# Patient Record
Sex: Female | Born: 1990 | Race: White | Hispanic: No | Marital: Married | Smoking: Never smoker
Health system: Southern US, Community
[De-identification: ages and names within clinical notes are randomized; demographics above are authoritative.]

## PROBLEM LIST (undated history)

## (undated) DIAGNOSIS — G43909 Migraine, unspecified, not intractable, without status migrainosus: Secondary | ICD-10-CM

---

## 2012-10-08 ENCOUNTER — Ambulatory Visit: Payer: Self-pay | Admitting: Nurse Practitioner

## 2012-10-11 ENCOUNTER — Encounter: Payer: Self-pay | Admitting: *Deleted

## 2012-10-19 ENCOUNTER — Encounter: Payer: Self-pay | Admitting: Nurse Practitioner

## 2012-10-19 ENCOUNTER — Ambulatory Visit (INDEPENDENT_AMBULATORY_CARE_PROVIDER_SITE_OTHER): Payer: BC Managed Care – PPO | Admitting: Nurse Practitioner

## 2012-10-19 VITALS — BP 138/84 | HR 70 | Wt 140.0 lb

## 2012-10-19 DIAGNOSIS — N946 Dysmenorrhea, unspecified: Secondary | ICD-10-CM

## 2012-10-19 DIAGNOSIS — G43829 Menstrual migraine, not intractable, without status migrainosus: Secondary | ICD-10-CM

## 2012-10-19 MED ORDER — NORGESTREL-ETHINYL ESTRADIOL 0.3-30 MG-MCG PO TABS: 1.0000 | ORAL_TABLET | Freq: Every day | ORAL | Status: DC

## 2012-10-21 ENCOUNTER — Encounter: Payer: Self-pay | Admitting: Nurse Practitioner

## 2012-10-21 DIAGNOSIS — N946 Dysmenorrhea, unspecified: Secondary | ICD-10-CM | POA: Insufficient documentation

## 2012-10-21 DIAGNOSIS — G43829 Menstrual migraine, not intractable, without status migrainosus: Secondary | ICD-10-CM | POA: Insufficient documentation

## 2012-10-21 NOTE — Assessment & Plan Note (Signed)
discussed options. Will switch pills to Lo Ovral. Start with next pack of pills. Patient to call back if symptoms persist, options include adding a patch at the end of her pack of pills or doing continuous pills. Use backup method first pack if needed.

## 2012-10-21 NOTE — Progress Notes (Signed)
Subjective:  Presents for recheck. Currently on Loestrin. For the past 3-4 months patient has had a dull achy headache between the eyes that began today after her last pill on week 3. Headache will last off and on for several days into her cycle. Cycle usually begins 3-4 days after her last active pill. Does not have headaches at any other time. At most 7/10 on a pain scale. Nausea but no vomiting. No photosensitivity or phonophobia. Has also begun having more cramps and nausea with her cycle, this had resolved. Currently on the third week in her pack, due for cycle next week. Denies any missed pills. No new sexual partners. No pelvic pain. No discharge.  Objective:   BP 138/84  Pulse 70  Wt 140 lb (63.504 kg)  LMP 09/26/2012 NAD. Alert, oriented. Lungs clear. Heart regular rate rhythm.  Assessment:Menstrual headache  Dysmenorrhea  Meds ordered this encounter  Medications  . DISCONTD: norethindrone-ethinyl estradiol-iron (MICROGESTIN FE,GILDESS FE,LOESTRIN FE) 1.5-30 MG-MCG tablet    Sig: Take 1 tablet by mouth daily.  . norgestrel-ethinyl estradiol (LO/OVRAL,CRYSELLE) 0.3-30 MG-MCG tablet    Sig: Take 1 tablet by mouth daily.    Dispense:  3 Package    Refill:  3    Order Specific Question:  Supervising Provider    Answer:  Merlyn Albert [[id number]]   discussed options. Will switch pills to Lo Ovral. Start with next pack of pills. Patient to call back if symptoms persist, options include adding a patch at the end of her pack of pills or doing continuous pills. Use backup method first pack if needed.

## 2012-10-25 ENCOUNTER — Telehealth: Payer: Self-pay | Admitting: Nurse Practitioner

## 2012-10-25 ENCOUNTER — Other Ambulatory Visit: Payer: Self-pay | Admitting: Nurse Practitioner

## 2012-10-25 MED ORDER — IBUPROFEN 800 MG PO TABS: 800.0000 mg | ORAL_TABLET | Freq: Three times a day (TID) | ORAL | Status: DC | PRN

## 2012-10-25 NOTE — Telephone Encounter (Signed)
Patient saw Sherie Don, FNP this past Monday, May 5, and Eber Jones told her she would give her an Rx for Prescription strength Tylenol if she decided she wanted it.  Patient would like this called into [facility] in [place].

## 2012-10-25 NOTE — Telephone Encounter (Signed)
What strength of the ibuprofren

## 2012-10-25 NOTE — Telephone Encounter (Signed)
This should be Rx strength Ibuprofen not Tylenol.  Will send in Rx

## 2012-10-25 NOTE — Telephone Encounter (Signed)
800 mg.  Please clarify message; I was going by previous call

## 2012-10-26 NOTE — Telephone Encounter (Signed)
Med was electronically sent to [facility]. Patient notified.

## 2013-05-03 ENCOUNTER — Other Ambulatory Visit: Payer: Self-pay | Admitting: *Deleted

## 2013-05-03 MED ORDER — NORGESTREL-ETHINYL ESTRADIOL 0.3-30 MG-MCG PO TABS: 1.0000 | ORAL_TABLET | Freq: Every day | ORAL | Status: DC

## 2013-10-10 ENCOUNTER — Encounter: Payer: Self-pay | Admitting: Nurse Practitioner

## 2013-10-10 ENCOUNTER — Ambulatory Visit (INDEPENDENT_AMBULATORY_CARE_PROVIDER_SITE_OTHER): Payer: BC Managed Care – PPO | Admitting: Nurse Practitioner

## 2013-10-10 VITALS — BP 112/80 | Ht 63.0 in | Wt 137.2 lb

## 2013-10-10 DIAGNOSIS — Z0189 Encounter for other specified special examinations: Secondary | ICD-10-CM

## 2013-10-10 DIAGNOSIS — Z0000 Encounter for general adult medical examination without abnormal findings: Secondary | ICD-10-CM

## 2013-10-11 LAB — VARICELLA ZOSTER ANTIBODY, IGG: Varicella IgG: 913.1 Index — ABNORMAL HIGH (ref <135.00)

## 2013-10-14 ENCOUNTER — Encounter: Payer: Self-pay | Admitting: Nurse Practitioner

## 2013-10-14 LAB — VARICELLA ZOSTER ANTIBODY, IGM: Varicella Zoster Ab IgM: 0.5 {ISR} (ref <0.91)

## 2013-10-14 NOTE — Progress Notes (Signed)
   Subjective:    Patient ID: Kara Serrano, female    DOB: 10-12-1990, 23 y.o.   MRN: [id number]  HPI Presents for her wellness physical. Reviewed guidelines for first PAP smear, patient has not had PAP before. Guidelines recommend first PAP at age 23 irregardless of sexual activity. Patient denies history of sexual activity and defers PAP/ GU exam today. Regular vision and dental exams. Regular menses, normal flow. Has form for school physical today.    Review of Systems  Constitutional: Negative for activity change, appetite change and fatigue.  HENT: Negative for dental problem, ear pain, sinus pressure and sore throat.   Respiratory: Negative for cough, chest tightness, shortness of breath and wheezing.   Cardiovascular: Negative for chest pain.  Gastrointestinal: Negative for nausea, vomiting, abdominal pain, diarrhea, constipation and abdominal distention.  Genitourinary: Negative for dysuria, urgency, frequency, vaginal discharge, difficulty urinating, genital sores, menstrual problem and pelvic pain.       Objective:   Physical Exam  Vitals reviewed. Constitutional: She is oriented to person, place, and time. She appears well-developed. No distress.  HENT:  Right Ear: External ear normal.  Left Ear: External ear normal.  Mouth/Throat: Oropharynx is clear and moist.  Neck: Normal range of motion. Neck supple. No tracheal deviation present. No thyromegaly present.  Cardiovascular: Normal rate, regular rhythm and normal heart sounds.  Exam reveals no gallop.   No murmur heard. Pulmonary/Chest: Effort normal and breath sounds normal.  Abdominal: Soft. She exhibits no distension. There is no tenderness.  Musculoskeletal: She exhibits no edema.  Lymphadenopathy:    She has no cervical adenopathy.  Neurological: She is alert and oriented to person, place, and time.  Skin: Skin is warm and dry. No rash noted.  Psychiatric: She has a normal mood and affect. Her behavior is  normal.  Breast exam: dense tissue; fine nodularity; no dominant masses; axillae no adenopathy.        Assessment & Plan:  Well woman exam (no gynecological exam)  Other specified examination - Plan: Varicella zoster antibody, IgG, Varicella zoster antibody, IgM  Recommend regular activity, healthy diet, vitamin D and calcium supplementation. Next PE in one year. Return in about 1 year (around 10/11/2014).

## 2013-10-15 ENCOUNTER — Ambulatory Visit (INDEPENDENT_AMBULATORY_CARE_PROVIDER_SITE_OTHER): Payer: BC Managed Care – PPO | Admitting: *Deleted

## 2013-10-15 DIAGNOSIS — Z111 Encounter for screening for respiratory tuberculosis: Secondary | ICD-10-CM

## 2013-10-17 ENCOUNTER — Other Ambulatory Visit: Payer: Self-pay | Admitting: *Deleted

## 2013-10-17 DIAGNOSIS — Z111 Encounter for screening for respiratory tuberculosis: Secondary | ICD-10-CM

## 2013-10-17 LAB — TB SKIN TEST
INDURATION: 0 mm
TB Skin Test: NEGATIVE

## 2014-03-23 ENCOUNTER — Other Ambulatory Visit: Payer: Self-pay | Admitting: Nurse Practitioner

## 2014-04-29 ENCOUNTER — Ambulatory Visit (INDEPENDENT_AMBULATORY_CARE_PROVIDER_SITE_OTHER): Payer: BC Managed Care – PPO | Admitting: *Deleted

## 2014-04-29 ENCOUNTER — Ambulatory Visit: Payer: BC Managed Care – PPO | Admitting: *Deleted

## 2014-04-29 DIAGNOSIS — Z23 Encounter for immunization: Secondary | ICD-10-CM

## 2014-06-04 ENCOUNTER — Encounter: Payer: Self-pay | Admitting: Family Medicine

## 2014-06-04 ENCOUNTER — Ambulatory Visit (INDEPENDENT_AMBULATORY_CARE_PROVIDER_SITE_OTHER): Payer: BC Managed Care – PPO | Admitting: Nurse Practitioner

## 2014-06-04 VITALS — BP 120/76 | Temp 99.0°F | Ht 63.0 in | Wt 149.0 lb

## 2014-06-04 DIAGNOSIS — J020 Streptococcal pharyngitis: Secondary | ICD-10-CM

## 2014-06-04 LAB — POCT RAPID STREP A (OFFICE): RAPID STREP A SCREEN: POSITIVE — AB

## 2014-06-04 MED ORDER — AZITHROMYCIN 250 MG PO TABS: ORAL_TABLET | ORAL | Status: DC

## 2014-06-07 ENCOUNTER — Encounter: Payer: Self-pay | Admitting: Nurse Practitioner

## 2014-06-07 NOTE — Progress Notes (Signed)
Subjective:  Presents complaints of sore throat and fever over the past 2-3 days. Began having a headache last night. No rash. Mild fatigue for 2-3 days. No vomiting diarrhea or abdominal pain. No cough or runny nose. Taking fluids well. Voiding normal limit.  Objective:   BP 120/76 mmHg  Temp(Src) 99 F (37.2 C) (Oral)  Ht 5\' 3"  (1.6 m)  Wt 149 lb (67.586 kg)  BMI 26.40 kg/m2 NAD. Alert, oriented. TMs mild clear effusion, no erythema. Pharynx minimally injected. Neck supple with mild anterior adenopathy. No posterior lymph nodes palpated. Lungs clear. Heart regular rate rhythm. Results for orders placed or performed in visit on 06/04/14  POCT rapid strep A  Result Value Ref Range   Rapid Strep A Screen Positive (A) Negative     Assessment: Streptococcal sore throat - Plan: POCT rapid strep A  Plan:  Meds ordered this encounter  Medications  . azithromycin (ZITHROMAX Z-PAK) 250 MG tablet    Sig: Take 2 tablets (500 mg) on  Day 1,  followed by 1 tablet (250 mg) once daily on Days 2 through 5.    Dispense:  6 each    Refill:  0    Order Specific Question:  Supervising Provider    Answer:  Merlyn AlbertLUKING, WILLIAM S [[id number]]   Call back in 48-72 hours if no improvement, sooner if worse.

## 2014-08-25 ENCOUNTER — Ambulatory Visit (INDEPENDENT_AMBULATORY_CARE_PROVIDER_SITE_OTHER): Payer: BLUE CROSS/BLUE SHIELD | Admitting: Family Medicine

## 2014-08-25 ENCOUNTER — Encounter: Payer: Self-pay | Admitting: Family Medicine

## 2014-08-25 VITALS — BP 128/74 | Temp 99.2°F | Ht 63.0 in | Wt 147.0 lb

## 2014-08-25 DIAGNOSIS — N3001 Acute cystitis with hematuria: Secondary | ICD-10-CM

## 2014-08-25 DIAGNOSIS — R350 Frequency of micturition: Secondary | ICD-10-CM | POA: Diagnosis not present

## 2014-08-25 LAB — POCT URINALYSIS DIPSTICK
PH UA: 5
Spec Grav, UA: 1.025

## 2014-08-25 MED ORDER — CIPROFLOXACIN HCL 250 MG PO TABS: 250.0000 mg | ORAL_TABLET | Freq: Two times a day (BID) | ORAL | Status: DC

## 2014-08-25 NOTE — Progress Notes (Signed)
   Subjective:    Patient ID: Kara Serrano, female    DOB: 07-07-1990, 24 y.o.   MRN: [id number]  Urinary Frequency  This is a new problem. Episode onset: 2 days ago. Maximum temperature: low grade fever. Associated symptoms include frequency and urgency. Associated symptoms comments: Low abd pressure, fullness. Treatments tried: cystex.    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       Objective:   Physical Exam  Alert no apparent distress. Lungs clear. Heartregular rate and rhythm. No CVA tenderness.  Urinalysis 2-4 white blood cells per high-power field      Assessment & Plan:  Impression urinary tract infection plan antibiotics prescribed symptom care discussed WSL

## 2014-09-14 ENCOUNTER — Encounter: Payer: Self-pay | Admitting: Family Medicine

## 2014-09-15 ENCOUNTER — Other Ambulatory Visit: Payer: Self-pay | Admitting: *Deleted

## 2014-09-15 MED ORDER — CEFPROZIL 500 MG PO TABS
500.0000 mg | ORAL_TABLET | Freq: Two times a day (BID) | ORAL | Status: DC
Start: 2014-09-15 — End: 2014-09-29

## 2014-09-29 ENCOUNTER — Encounter: Payer: Self-pay | Admitting: Nurse Practitioner

## 2014-09-29 ENCOUNTER — Ambulatory Visit (INDEPENDENT_AMBULATORY_CARE_PROVIDER_SITE_OTHER): Payer: BLUE CROSS/BLUE SHIELD | Admitting: Nurse Practitioner

## 2014-09-29 VITALS — BP 128/82 | Temp 98.7°F | Ht 63.0 in | Wt 149.0 lb

## 2014-09-29 DIAGNOSIS — B373 Candidiasis of vulva and vagina: Secondary | ICD-10-CM | POA: Diagnosis not present

## 2014-09-29 DIAGNOSIS — R350 Frequency of micturition: Secondary | ICD-10-CM

## 2014-09-29 DIAGNOSIS — B3731 Acute candidiasis of vulva and vagina: Secondary | ICD-10-CM

## 2014-09-29 LAB — POCT URINALYSIS DIPSTICK
PH UA: 5.5
Spec Grav, UA: 1.015

## 2014-09-29 MED ORDER — CEFPROZIL 500 MG PO TABS: 500.0000 mg | ORAL_TABLET | Freq: Two times a day (BID) | ORAL | Status: DC

## 2014-09-29 MED ORDER — FLUCONAZOLE 150 MG PO TABS: ORAL_TABLET | ORAL | Status: DC

## 2014-10-01 LAB — URINE CULTURE: Organism ID, Bacteria: NO GROWTH

## 2014-10-03 ENCOUNTER — Ambulatory Visit (INDEPENDENT_AMBULATORY_CARE_PROVIDER_SITE_OTHER): Payer: BLUE CROSS/BLUE SHIELD | Admitting: Nurse Practitioner

## 2014-10-03 ENCOUNTER — Encounter: Payer: Self-pay | Admitting: Nurse Practitioner

## 2014-10-03 VITALS — BP 128/82 | Temp 98.6°F | Ht 63.0 in | Wt 146.2 lb

## 2014-10-03 DIAGNOSIS — K5900 Constipation, unspecified: Secondary | ICD-10-CM

## 2014-10-03 DIAGNOSIS — M5432 Sciatica, left side: Secondary | ICD-10-CM

## 2014-10-03 DIAGNOSIS — R300 Dysuria: Secondary | ICD-10-CM | POA: Diagnosis not present

## 2014-10-03 LAB — POCT URINALYSIS DIPSTICK
Spec Grav, UA: 1.015
pH, UA: 6

## 2014-10-03 MED ORDER — METRONIDAZOLE 500 MG PO TABS: 500.0000 mg | ORAL_TABLET | Freq: Two times a day (BID) | ORAL | Status: DC

## 2014-10-03 MED ORDER — NAPROXEN 500 MG PO TABS: 500.0000 mg | ORAL_TABLET | Freq: Two times a day (BID) | ORAL | Status: DC

## 2014-10-03 NOTE — Patient Instructions (Addendum)
miralax  Fleets enema Magnesium citrate Back Exercises Back exercises help treat and prevent back injuries. The goal of back exercises is to increase the strength of your abdominal and back muscles and the flexibility of your back. These exercises should be started when you no longer have back pain. Back exercises include:  Pelvic Tilt. Lie on your back with your knees bent. Tilt your pelvis until the lower part of your back is against the floor. Hold this position 5 to 10 sec and repeat 5 to 10 times.  Knee to Chest. Pull first 1 knee up against your chest and hold for 20 to 30 seconds, repeat this with the other knee, and then both knees. This may be done with the other leg straight or bent, whichever feels better.  Sit-Ups or Curl-Ups. Bend your knees 90 degrees. Start with tilting your pelvis, and do a partial, slow sit-up, lifting your trunk only 30 to 45 degrees off the floor. Take at least 2 to 3 seconds for each sit-up. Do not do sit-ups with your knees out straight. If partial sit-ups are difficult, simply do the above but with only tightening your abdominal muscles and holding it as directed.  Hip-Lift. Lie on your back with your knees flexed 90 degrees. Push down with your feet and shoulders as you raise your hips a couple inches off the floor; hold for 10 seconds, repeat 5 to 10 times.  Back arches. Lie on your stomach, propping yourself up on bent elbows. Slowly press on your hands, causing an arch in your low back. Repeat 3 to 5 times. Any initial stiffness and discomfort should lessen with repetition over time.  Shoulder-Lifts. Lie face down with arms beside your body. Keep hips and torso pressed to floor as you slowly lift your head and shoulders off the floor. Do not overdo your exercises, especially in the beginning. Exercises may cause you some mild back discomfort which lasts for a few minutes; however, if the pain is more severe, or lasts for more than 15 minutes, do not  continue exercises until you see your caregiver. Improvement with exercise therapy for back problems is slow.  See your caregivers for assistance with developing a proper back exercise program. Document Released: 07/14/2004 Document Revised: 08/29/2011 Document Reviewed: 04/07/2011 [facility] Patient Information 2015 [place] [place]. This information is not intended to replace advice given to you by your health care provider. Make sure you discuss any questions you have with your health care provider.

## 2014-10-04 ENCOUNTER — Encounter: Payer: Self-pay | Admitting: Nurse Practitioner

## 2014-10-04 LAB — POCT UA - MICROSCOPIC ONLY
BACTERIA, U MICROSCOPIC: NEGATIVE
RBC, urine, microscopic: NEGATIVE
WBC, Ur, HPF, POC: NEGATIVE

## 2014-10-04 LAB — POCT WET PREP WITH KOH
BACTERIA WET PREP HPF POC: NEGATIVE
EPITHELIAL WET PREP PER HPF POC: POSITIVE
KOH Prep POC: NEGATIVE
RBC WET PREP PER HPF POC: NEGATIVE
TRICHOMONAS UA: NEGATIVE
WBC Wet Prep HPF POC: NEGATIVE
pH, Wet Prep: 5

## 2014-10-04 NOTE — Progress Notes (Signed)
Subjective:  Presents for c/o recheck of UTI. Completed Cipro given on 3/7. Symptoms improved until 3 days ago. No dysuria. Frequency mainly at night. Low back pain. No dribbling. Voiding regular amounts about 4 times per night. No constipation or diarrhea. White vaginal discharge with slight odor. Denies any history of sexual activity. External itching and burning. Has used Monistat x 2 d which has helped. No fever. Taking fluids well. Nausea, no vomiting.   Objective:   BP 128/82 mmHg  Temp(Src) 98.7 F (37.1 C)  Ht 5\' 3"  (1.6 m)  Wt 149 lb (67.586 kg)  BMI 26.40 kg/m2  LMP 08/29/2014 (Approximate) NAD. Alert, oriented. Lungs clear. Heart RRR. No CVA or flank tenderness. External GU: no rashes or lesions. Vagina: moderate amount thick white discharge, no odor. Cervix normal in appearance. Bimanual exam: no tenderness or masses. Results for orders placed or performed in visit on 09/29/14  Urine culture  Result Value Ref Range            POCT urinalysis dipstick  Result Value Ref Range   Color, UA     Clarity, UA     Glucose, UA     Bilirubin, UA     Ketones, UA     Spec Grav, UA 1.015    Blood, UA     pH, UA 5.5    Protein, UA     Urobilinogen, UA     Nitrite, UA     Leukocytes, UA moderate (2+)   POCT UA - Microscopic Only  Result Value Ref Range   WBC, Ur, HPF, POC neg    RBC, urine, microscopic neg    Bacteria, U Microscopic neg    Mucus, UA     Epithelial cells, urine per micros     Crystals, Ur, HPF, POC     Casts, Ur, LPF, POC     Yeast, UA    POCT Wet Prep with KOH  Result Value Ref Range   Trichomonas, UA Negative    Clue Cells Wet Prep HPF POC rare    Epithelial Wet Prep HPF POC pos    Yeast Wet Prep HPF POC rare    Bacteria Wet Prep HPF POC neg    RBC Wet Prep HPF POC neg    WBC Wet Prep HPF POC neg    KOH Prep POC Negative    pH, Wet Prep 5.0       Assessment: Urinary frequency - Plan: POCT urinalysis dipstick, POCT UA - Microscopic Only, Urine  culture  Vaginal candidiasis - Plan: Urine culture  Plan:  Meds ordered this encounter  Medications  . fluconazole (DIFLUCAN) 150 MG tablet    Sig: One po qd prn yeast infection; may repeat in 3-4 days if needed    Dispense:  2 tablet    Refill:  0    Order Specific Question:  Supervising Provider    Answer:  Merlyn AlbertLUKING, WILLIAM S [[id number]]  . cefPROZIL (CEFZIL) 500 MG tablet    Sig: Take 1 tablet (500 mg total) by mouth 2 (two) times daily.    Dispense:  14 tablet    Refill:  0    Order Specific Question:  Supervising Provider    Answer:  Merlyn AlbertLUKING, WILLIAM S [[id number]]   With recent history of UTI, started on Cefzil. Call back by end of week if no better, sooner if worse. Urine culture pending.

## 2014-10-05 ENCOUNTER — Encounter: Payer: Self-pay | Admitting: Nurse Practitioner

## 2014-10-05 NOTE — Progress Notes (Signed)
Subjective:  Presents with her mother for recheck. No missed oc's. No change in pills. No fever. Getting up at night to urinate. Up to 4 times per night. Minimal burning at the end. Unsure if she has any edema after school, clinical and work. No pelvic pain. Denies history of intercourse. 2 days ago began having tenderness directly behind belly button. Now describes as occasional ache. Mostly resolved. Back pain on left side into left buttock. Does not radiate down leg. Has had this once before. No specific history of injury. No incontinence. 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 Better. At least 2-3 days since her last BM. Yellowish vaginal discharge.   Objective:   BP 128/82 mmHg  Temp(Src) 98.6 F (37 C) (Oral)  Ht 5\' 3"  (1.6 m)  Wt 146 lb 3.2 oz (66.316 kg)  BMI 25.90 kg/m2  LMP 08/29/2014 (Approximate) NAD. Alert, oriented. Cheerful affect. Lungs clear. No CVA or flank tenderness. Tenderness left lumbar area into mid left buttock. SLR slightly positive on the left; negative on right. Reflexes nl lower extremities. Abdomen soft, non distended with mild tenderness with palpation of umbilical area. No masses or hernia noted with valsalva. Urine HCG neg. Normal bowel sounds.  Results for orders placed or performed in visit on 10/03/14  POCT urinalysis dipstick  Result Value Ref Range   Color, UA     Clarity, UA     Glucose, UA     Bilirubin, UA     Ketones, UA     Spec Grav, UA 1.015    Blood, UA     pH, UA 6.0    Protein, UA     Urobilinogen, UA     Nitrite, UA     Leukocytes, UA       Assessment: Dysuria - Plan: POCT urinalysis dipstick  Sciatica, left  Constipation, unspecified constipation type  Plan:  Meds ordered this encounter  Medications  . metroNIDAZOLE (FLAGYL) 500 MG tablet    Sig: Take 1 tablet (500 mg total) by mouth 2 (two) times daily with a meal.    Dispense:  14 tablet    Refill:  0    Order Specific Question:  Supervising Provider   Answer:  Merlyn AlbertLUKING, WILLIAM S [[id number]]  . naproxen (NAPROSYN) 500 MG tablet    Sig: Take 1 tablet (500 mg total) by mouth 2 (two) times daily with a meal.    Dispense:  30 tablet    Refill:  0    Order Specific Question:  Supervising Provider    Answer:  Merlyn AlbertLUKING, WILLIAM S [[id number]]   miralax as directed for constipation. Ice/heat to back. Given info on back exercises. Call back next week if no improvement, sooner if worse.

## 2014-11-28 ENCOUNTER — Other Ambulatory Visit: Payer: Self-pay | Admitting: Nurse Practitioner

## 2015-01-09 ENCOUNTER — Other Ambulatory Visit: Payer: Self-pay | Admitting: Nurse Practitioner

## 2015-02-09 ENCOUNTER — Encounter: Payer: Self-pay | Admitting: Nurse Practitioner

## 2015-02-09 ENCOUNTER — Ambulatory Visit (INDEPENDENT_AMBULATORY_CARE_PROVIDER_SITE_OTHER): Payer: BLUE CROSS/BLUE SHIELD | Admitting: Nurse Practitioner

## 2015-02-09 VITALS — BP 118/80 | Temp 98.5°F | Ht 63.0 in | Wt 148.0 lb

## 2015-02-09 DIAGNOSIS — G4440 Drug-induced headache, not elsewhere classified, not intractable: Secondary | ICD-10-CM | POA: Diagnosis not present

## 2015-02-09 DIAGNOSIS — G43829 Menstrual migraine, not intractable, without status migrainosus: Secondary | ICD-10-CM | POA: Diagnosis not present

## 2015-02-09 DIAGNOSIS — N946 Dysmenorrhea, unspecified: Secondary | ICD-10-CM

## 2015-02-09 DIAGNOSIS — N943 Premenstrual tension syndrome: Secondary | ICD-10-CM | POA: Diagnosis not present

## 2015-02-09 DIAGNOSIS — T3995XA Adverse effect of unspecified nonopioid analgesic, antipyretic and antirheumatic, initial encounter: Secondary | ICD-10-CM

## 2015-02-09 MED ORDER — NORGESTREL-ETHINYL ESTRADIOL 0.3-30 MG-MCG PO TABS: 1.0000 | ORAL_TABLET | Freq: Every day | ORAL | Status: DC

## 2015-02-09 MED ORDER — TOPIRAMATE 50 MG PO TABS: ORAL_TABLET | ORAL | Status: DC

## 2015-02-11 ENCOUNTER — Encounter: Payer: Self-pay | Admitting: Nurse Practitioner

## 2015-02-11 NOTE — Progress Notes (Signed)
Subjective:  Presents for recheck on her migraines. Has been having headaches around the time of her cycle. Last month, headache lasted during the entire menses. Also having migraines at other times especially around 3-4 pm. Throbbing pounding pain in the frontal area. Takes 2 Ibuprofen and 2 tylenol with slight relief so she can work. 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 Taking daily analgesics including tylenol PM every night and averaging Aleve at least 3-4 days per week. No change in migraine symptomatology. No numbness or weakness of the face, arms or legs. No difficulty speaking or swallowing. Nausea, no vomiting. Photosensitivity and phonophobia. No visual changes. Increased allergy symptoms including sneezing and mild head congestion. No fever, sore throat, ear pain or cough. Dysmenorrhea much improved on oc. No heavy menses. No BTB.  Objective:   BP 118/80 mmHg  Temp(Src) 98.5 F (36.9 C) (Oral)  Ht  (1.6 m)  Wt 148 lb (67.132 kg)  BMI 26.22 kg/m2  LMP 01/19/2015 NAD. Alert, oriented. TMs minimal clear effusion. Pharynx clear. Neck supple with mild anterior adenopathy. Lungs clear. Heart RRR.   Assessment:  Problem List Items Addressed This Visit      Cardiovascular and Mediastinum   Menstrual headache - Primary   Relevant Medications   topiramate (TOPAMAX) 50 MG tablet     Genitourinary   Dysmenorrhea    Other Visit Diagnoses    Analgesic rebound headache        Relevant Medications    topiramate (TOPAMAX) 50 MG tablet      Plan:  Meds ordered this encounter  Medications  . clobetasol ointment (TEMOVATE) 0.05 %    Sig:   . topiramate (TOPAMAX) 50 MG tablet    Sig: One tab po qhs x 7 d then 1 1/2 tab po qhs x 7d then 2 po qhs    Dispense:  46 tablet    Refill:  0    Order Specific Question:  Supervising Provider    Answer:  Merlyn Albert [[id number]]  . norgestrel-ethinyl estradiol (LOW-OGESTREL) 0.3-30 MG-MCG tablet    Sig: Take 1  tablet by mouth daily.    Dispense:  84 tablet    Refill:  3    Needs office visit    Order Specific Question:  Supervising Provider    Answer:  Merlyn Albert [[id number]]   Discussed options. Increase evening dose of Topamax. Wishes to continue current pill. Discussed overuse of analgesics. Try to wean off over next 2 weeks. Warning signs reviewed. Call back in 2 weeks if no improvement.

## 2015-02-18 ENCOUNTER — Encounter: Payer: Self-pay | Admitting: Nurse Practitioner

## 2015-02-25 ENCOUNTER — Encounter: Payer: Self-pay | Admitting: Nurse Practitioner

## 2015-02-25 ENCOUNTER — Other Ambulatory Visit: Payer: Self-pay | Admitting: Family Medicine

## 2015-03-03 ENCOUNTER — Encounter: Payer: Self-pay | Admitting: Nurse Practitioner

## 2015-03-10 ENCOUNTER — Telehealth: Payer: Self-pay | Admitting: Nurse Practitioner

## 2015-03-10 NOTE — Telephone Encounter (Signed)
Pt states she has been communicating with you through mychart. She stopped topamax because of the way it made her feel. She states you recommended a bp med. She wants to go ahead and start this med. Pt aware you are out of office today and will return tomorrow. [facility] [place].

## 2015-03-10 NOTE — Telephone Encounter (Signed)
Pt is wanting to get started on taking the bp meds that carolyn suggested for her headaches. Pt would like for the nurse to call her back regarding this.

## 2015-03-11 ENCOUNTER — Other Ambulatory Visit: Payer: Self-pay | Admitting: Nurse Practitioner

## 2015-03-11 MED ORDER — PROPRANOLOL HCL ER 80 MG PO CP24: 80.0000 mg | ORAL_CAPSULE | Freq: Every day | ORAL | Status: DC

## 2015-03-11 NOTE — Telephone Encounter (Signed)
Left message on voicemail notifying patient med was sent to pharmacy and to call back if no improvement in 4-6 weeks.

## 2015-03-11 NOTE — Telephone Encounter (Signed)
Med sent in to [facility]. Call back if any problems or if no improvement within 4-6 weeks.

## 2015-03-26 ENCOUNTER — Other Ambulatory Visit: Payer: Self-pay | Admitting: Nurse Practitioner

## 2015-03-26 ENCOUNTER — Telehealth: Payer: Self-pay | Admitting: Nurse Practitioner

## 2015-03-26 DIAGNOSIS — G43829 Menstrual migraine, not intractable, without status migrainosus: Secondary | ICD-10-CM

## 2015-03-26 NOTE — Telephone Encounter (Signed)
NTC: first I recommend going up one dose on current daily med; if no improvement, we will go ahead and switch. I will send in referral to specialist for headaches.

## 2015-03-26 NOTE — Telephone Encounter (Signed)
Pt calling about her migraines, the meds you issued are not working She would like to go ahead with a referral to the specialist. Is there Another med she can use? No side effects, just not working.   [facility]

## 2015-03-26 NOTE — Telephone Encounter (Signed)
Discussed with pt. Pt does want referral and to go up on dose. Pt notified that med will be sent in later today.

## 2015-03-30 ENCOUNTER — Other Ambulatory Visit: Payer: Self-pay | Admitting: Nurse Practitioner

## 2015-03-30 MED ORDER — PROPRANOLOL HCL ER 120 MG PO CP24: 120.0000 mg | ORAL_CAPSULE | Freq: Every day | ORAL | Status: AC

## 2015-04-07 ENCOUNTER — Encounter: Payer: Self-pay | Admitting: Family Medicine

## 2015-05-12 ENCOUNTER — Ambulatory Visit: Payer: BLUE CROSS/BLUE SHIELD | Admitting: Nurse Practitioner

## 2015-09-02 ENCOUNTER — Encounter: Payer: Self-pay | Admitting: Nurse Practitioner

## 2015-09-02 ENCOUNTER — Other Ambulatory Visit: Payer: Self-pay | Admitting: Nurse Practitioner

## 2015-09-02 MED ORDER — NORGESTREL-ETHINYL ESTRADIOL 0.3-30 MG-MCG PO TABS: 1.0000 | ORAL_TABLET | Freq: Every day | ORAL | Status: AC

## 2019-07-22 ENCOUNTER — Encounter: Payer: Self-pay | Admitting: Family Medicine

## 2019-12-02 ENCOUNTER — Emergency Department (HOSPITAL_COMMUNITY)
Admission: EM | Admit: 2019-12-02 | Discharge: 2019-12-03 | Disposition: A | Discharge status: Home / Self Care | Payer: Managed Care, Other (non HMO) | Attending: Emergency Medicine | Admitting: Emergency Medicine

## 2019-12-02 ENCOUNTER — Emergency Department (HOSPITAL_COMMUNITY): Payer: Managed Care, Other (non HMO)

## 2019-12-02 ENCOUNTER — Other Ambulatory Visit: Payer: Self-pay

## 2019-12-02 ENCOUNTER — Encounter (HOSPITAL_COMMUNITY): Payer: Self-pay

## 2019-12-02 DIAGNOSIS — R109 Unspecified abdominal pain: Secondary | ICD-10-CM | POA: Diagnosis not present

## 2019-12-02 DIAGNOSIS — Y92410 Unspecified street and highway as the place of occurrence of the external cause: Secondary | ICD-10-CM | POA: Diagnosis not present

## 2019-12-02 DIAGNOSIS — Y93I9 Activity, other involving external motion: Secondary | ICD-10-CM | POA: Insufficient documentation

## 2019-12-02 DIAGNOSIS — Z79899 Other long term (current) drug therapy: Secondary | ICD-10-CM | POA: Diagnosis not present

## 2019-12-02 DIAGNOSIS — S92001A Unspecified fracture of right calcaneus, initial encounter for closed fracture: Secondary | ICD-10-CM

## 2019-12-02 DIAGNOSIS — S99921A Unspecified injury of right foot, initial encounter: Secondary | ICD-10-CM | POA: Diagnosis present

## 2019-12-02 DIAGNOSIS — S2231XA Fracture of one rib, right side, initial encounter for closed fracture: Secondary | ICD-10-CM

## 2019-12-02 DIAGNOSIS — R0789 Other chest pain: Secondary | ICD-10-CM | POA: Diagnosis not present

## 2019-12-02 DIAGNOSIS — Y999 Unspecified external cause status: Secondary | ICD-10-CM | POA: Insufficient documentation

## 2019-12-02 DIAGNOSIS — R519 Headache, unspecified: Secondary | ICD-10-CM | POA: Insufficient documentation

## 2019-12-02 HISTORY — DX: Migraine, unspecified, not intractable, without status migrainosus: G43.909

## 2019-12-02 LAB — CBC
HCT: 43.8 % (ref 36.0–46.0)
Hemoglobin: 14.1 g/dL (ref 12.0–15.0)
MCH: 30.9 pg (ref 26.0–34.0)
MCHC: 32.2 g/dL (ref 30.0–36.0)
MCV: 95.8 fL (ref 80.0–100.0)
Platelets: 343 10*3/uL (ref 150–400)
RBC: 4.57 MIL/uL (ref 3.87–5.11)
RDW: 12.9 % (ref 11.5–15.5)
WBC: 20.1 10*3/uL — ABNORMAL HIGH (ref 4.0–10.5)
nRBC: 0 % (ref 0.0–0.2)

## 2019-12-02 LAB — SAMPLE TO BLOOD BANK

## 2019-12-02 LAB — BASIC METABOLIC PANEL
Anion gap: 12 (ref 5–15)
BUN: 20 mg/dL (ref 6–20)
CO2: 18 mmol/L — ABNORMAL LOW (ref 22–32)
Calcium: 8.6 mg/dL — ABNORMAL LOW (ref 8.9–10.3)
Chloride: 108 mmol/L (ref 98–111)
Creatinine, Ser: 0.76 mg/dL (ref 0.44–1.00)
GFR calc Af Amer: >60 mL/min (ref >60)
GFR calc non Af Amer: >60 mL/min (ref >60)
Glucose, Bld: 95 mg/dL (ref 70–99)
Potassium: 4 mmol/L (ref 3.5–5.1)
Sodium: 138 mmol/L (ref 135–145)

## 2019-12-02 LAB — HEPATIC FUNCTION PANEL
ALT: 24 U/L (ref 0–44)
AST: 34 U/L (ref 15–41)
Albumin: 3.9 g/dL (ref 3.5–5.0)
Alkaline Phosphatase: 69 U/L (ref 38–126)
Bilirubin, Direct: 0.3 mg/dL — ABNORMAL HIGH (ref 0.0–0.2)
Indirect Bilirubin: 0.5 mg/dL (ref 0.3–0.9)
Total Bilirubin: 0.8 mg/dL (ref 0.3–1.2)
Total Protein: 6.6 g/dL (ref 6.5–8.1)

## 2019-12-02 LAB — I-STAT BETA HCG BLOOD, ED (MC, WL, AP ONLY): I-stat hCG, quantitative: <5 m[IU]/mL (ref <5)

## 2019-12-02 MED ORDER — FENTANYL CITRATE (PF) 100 MCG/2ML IJ SOLN
50.0000 ug | Freq: Once | INTRAMUSCULAR | Status: AC
  Administered 2019-12-02: 50 ug via INTRAVENOUS
  Filled 2019-12-02: qty 2

## 2019-12-02 MED ORDER — SODIUM CHLORIDE 0.9 % IV BOLUS
500.0000 mL | Freq: Once | INTRAVENOUS | Status: AC
  Administered 2019-12-02: 500 mL via INTRAVENOUS

## 2019-12-02 MED ORDER — IOHEXOL 300 MG/ML  SOLN
100.0000 mL | Freq: Once | INTRAMUSCULAR | Status: AC | PRN
  Administered 2019-12-02: 100 mL via INTRAVENOUS

## 2019-12-02 MED ORDER — SODIUM CHLORIDE 0.9 % IV BOLUS
500.0000 mL | Freq: Once | INTRAVENOUS | Status: AC
  Administered 2019-12-03: 500 mL via INTRAVENOUS
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
## 2019-12-03 ENCOUNTER — Encounter: Payer: Self-pay | Admitting: Nurse Practitioner

## 2019-12-03 MED ORDER — OXYCODONE HCL 5 MG PO TABS: 5.0000 mg | ORAL_TABLET | ORAL | 0 refills | Status: AC | PRN

## 2019-12-03 NOTE — Progress Notes (Signed)
Orthopedic Tech Progress Note Patient Details:  Stephani Janak 1990-08-24 [id number]  Ortho Devices Type of Ortho Device: Crutches, Lenora Boys splint Ortho Device/Splint Location: rle Ortho Device/Splint Interventions: Ordered, Application, Adjustment   Post Interventions Patient Tolerated: Well Instructions Provided: Care of device, Adjustment of device   Trinna Post 12/03/2019, 1:33 AM

## 2019-12-03 NOTE — Discharge Instructions (Signed)
You have a fracture of your calcaneus bone.  Do not bear any weight your right lower extremity.  Follow-up with orthopedics on Wednesday morning at 8:30 AM.  Please take Tylenol, Motrin,  and Roxicodone as prescribed for pain.  Please keep your leg elevated above the level of your heart as much as possible.

## 2021-04-06 ENCOUNTER — Other Ambulatory Visit: Payer: Self-pay | Admitting: Orthopaedic Surgery

## 2021-04-06 DIAGNOSIS — M25571 Pain in right ankle and joints of right foot: Secondary | ICD-10-CM

## 2021-04-28 ENCOUNTER — Ambulatory Visit
Admission: RE | Admit: 2021-04-28 | Discharge: 2021-04-28 | Disposition: A | Discharge status: Home / Self Care | Payer: Managed Care, Other (non HMO) | Source: Ambulatory Visit | Attending: Orthopaedic Surgery | Admitting: Orthopaedic Surgery

## 2021-04-28 ENCOUNTER — Other Ambulatory Visit: Payer: Self-pay

## 2021-04-28 DIAGNOSIS — M25571 Pain in right ankle and joints of right foot: Secondary | ICD-10-CM

## 2022-07-26 IMAGING — MR MR ANKLE*R* W/O CM
4 of 7 series · 13 of 40 positions shown · non-contrast
Comparison: CT right foot dated December 02, 2019.

CLINICAL DATA: Chronic medial ankle pain. History of prior surgery
in 3131.

EXAM:
MRI OF THE RIGHT ANKLE WITHOUT CONTRAST
TECHNIQUE: Multiplanar, multisequence MR imaging of the ankle was performed. No
intravenous contrast was administered.

[Series 4: PD fat-sat · axial · right · 3.0mm · 0.25mm/px · z∈[-77,+47]mm · 4 of 32 slices shown]
[im 1/32]
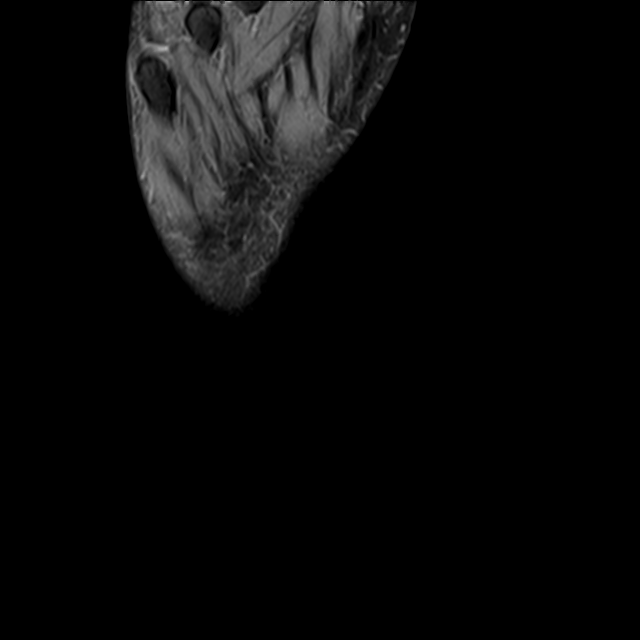
[im 7/32]
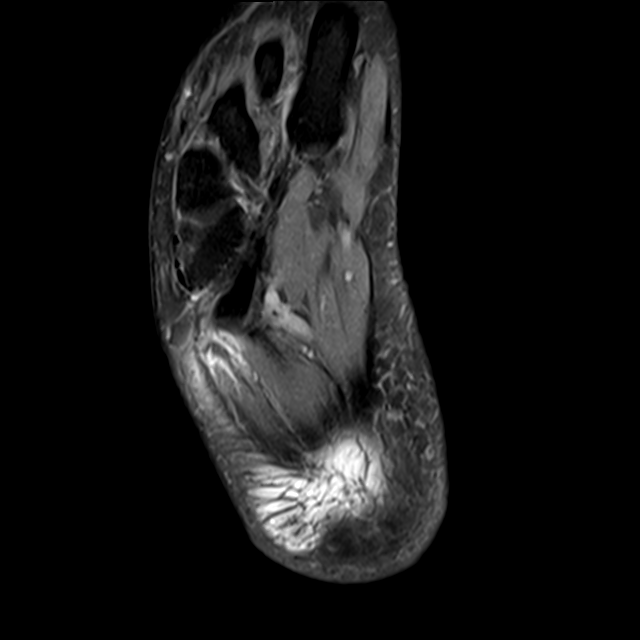
[im 19/32]
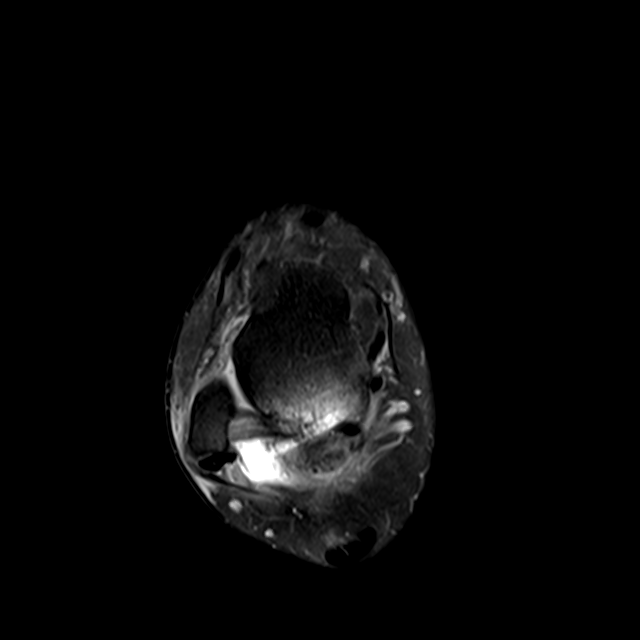
[im 32/32]
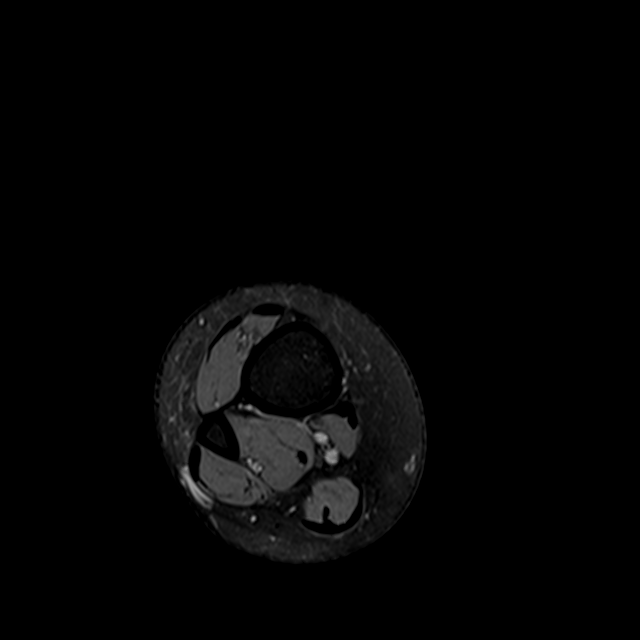

[Series 5: T1 · sagittal · right · 4.0mm · 0.27mm/px · 3 of 22 slices shown (1 of 2)]
[im 1/22]
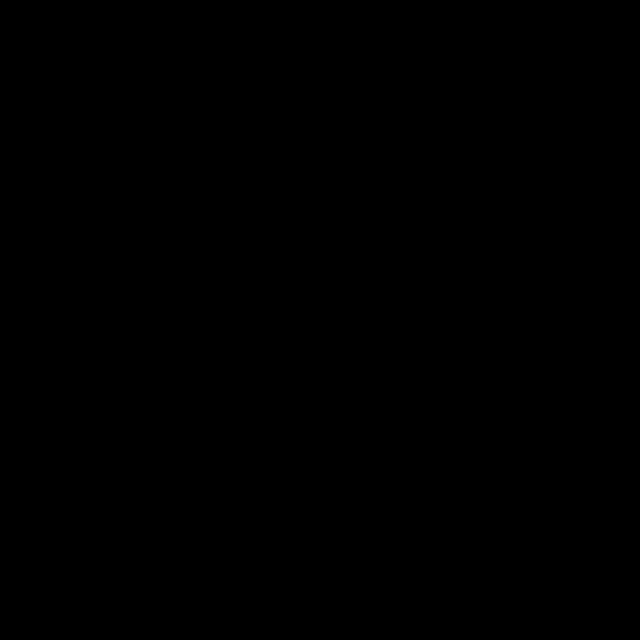
[im 11/22]
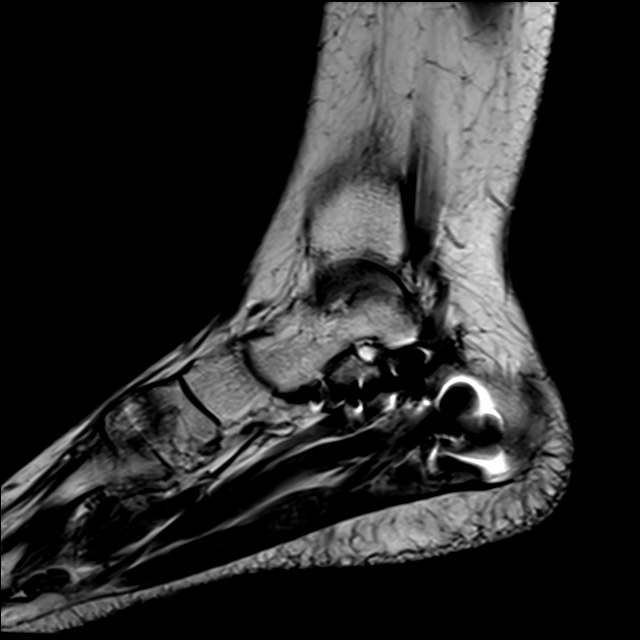
[im 22/22]
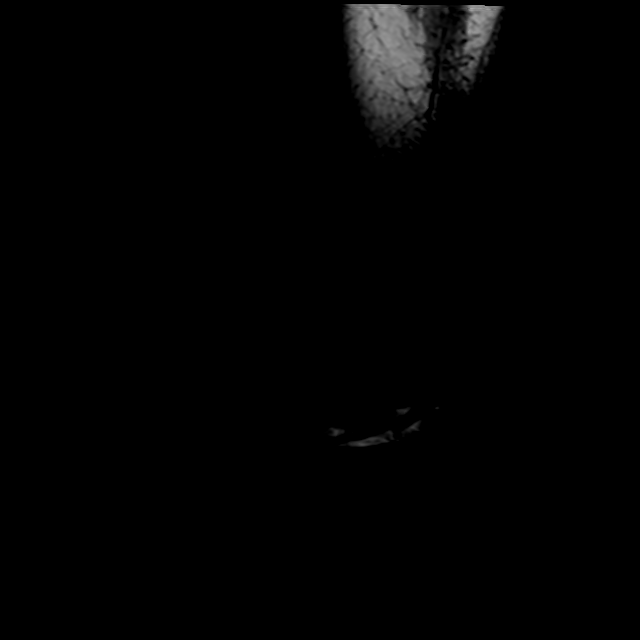

[Series 6: PD · axial · right · 3.0mm · 0.25mm/px · z∈[-57,+23]mm · 3 of 32 slices shown]
[im 6/32]
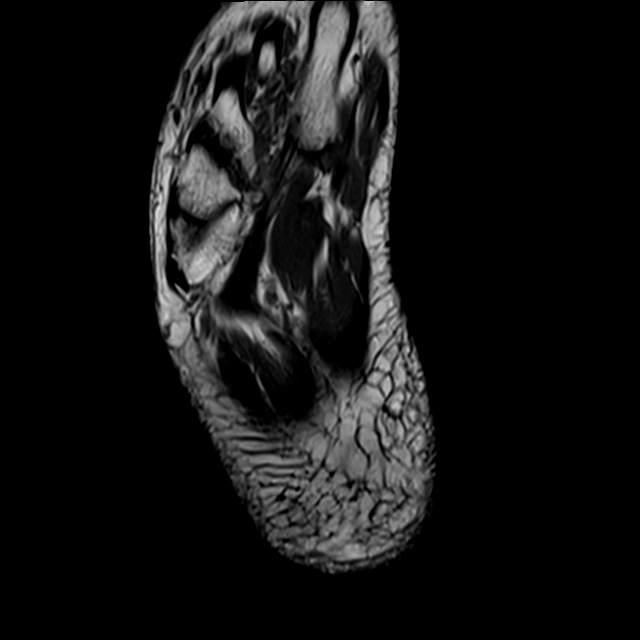
[im 16/32]
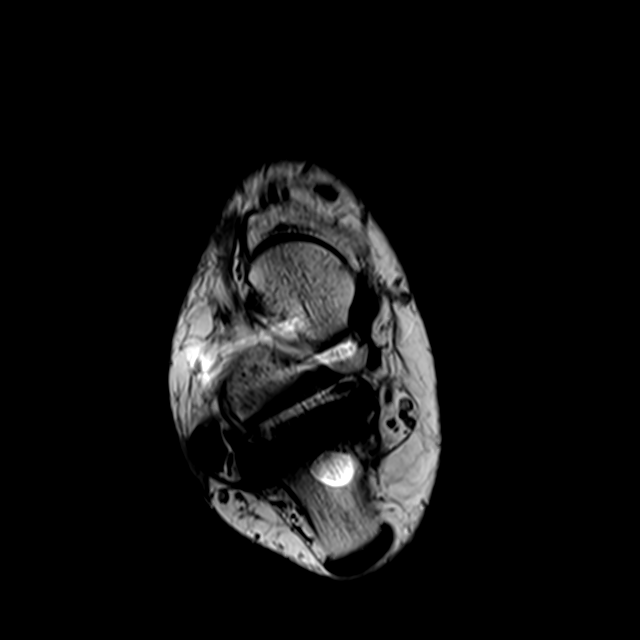
[im 26/32]
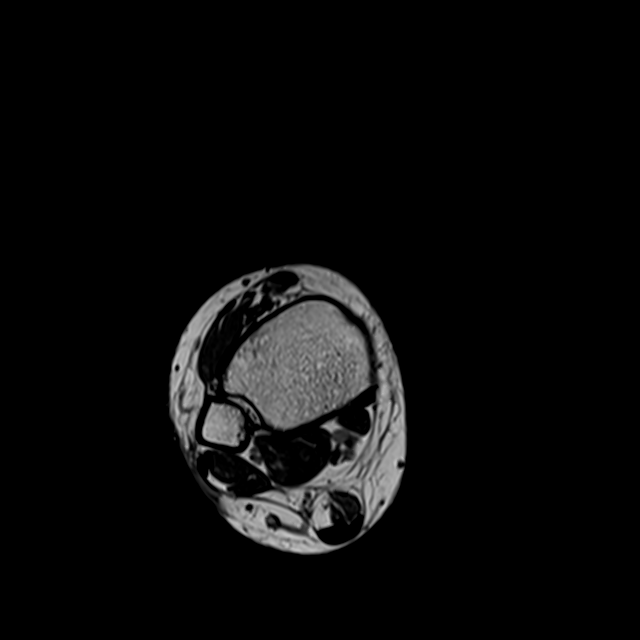

[Series 11: T1 · coronal · right · 4.0mm · 0.27mm/px · 3 of 22 slices shown (2 of 2)]
[im 1/22]
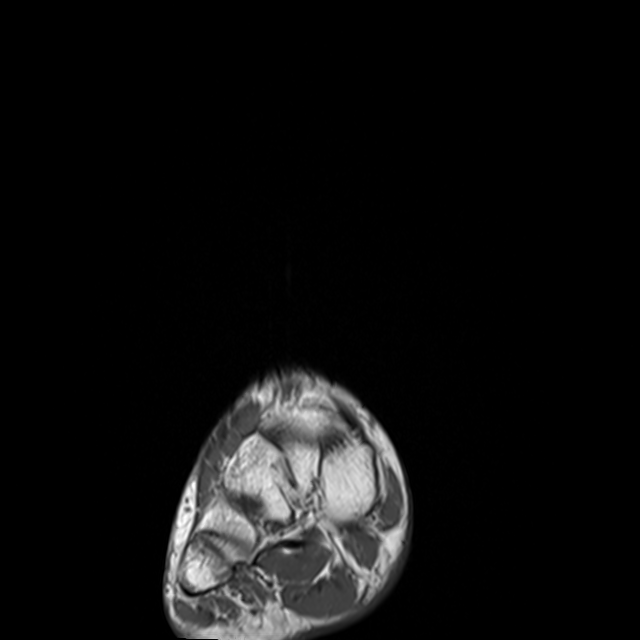
[im 11/22]
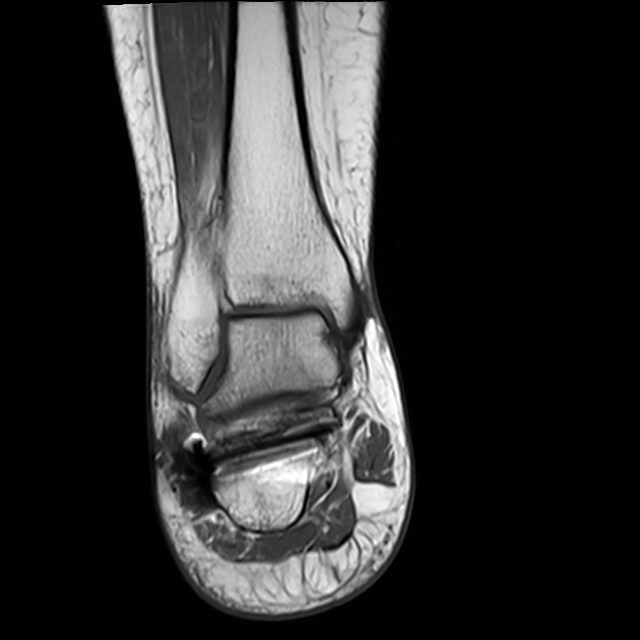
[im 22/22]
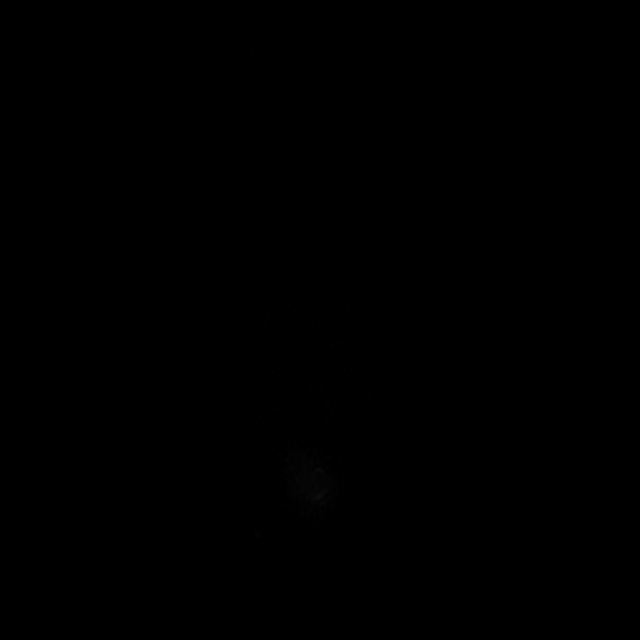

[13 of 40 positions shown; findings below may reference images not displayed]

FINDINGS: TENDONS

Peroneal: Peroneal longus tendon intact. Peroneal brevis intact.

Posteromedial: Posterior tibial tendon intact. Flexor digitorum
longus tendon intact. Flexor hallucis longus tendon intact.

Anterior: Tibialis anterior tendon intact. Extensor hallucis longus
tendon intact Extensor digitorum longus tendon intact.

Achilles:  Intact.

Plantar Fascia: Intact.

LIGAMENTS

Lateral: Anterior talofibular ligament intact. Calcaneofibular
ligament intact. Posterior talofibular ligament intact. Anterior and
posterior tibiofibular ligaments intact.

Medial: Deltoid ligament intact. Spring ligament intact.

CARTILAGE

Ankle Joint: No joint effusion. Normal ankle mortise. No chondral
defect.

Subtalar Joints/Sinus Tarsi: New mild osteoarthritis of posterior
subtalar joint subchondral marrow edema and marginal osteophytes. No
subtalar joint effusion. Mild edema in the lateral talar process.
Normal sinus tarsi.

Bones: No acute fracture or dislocation. Prior calcaneal ORIF with
associated susceptibility artifact.

Soft Tissue: No soft tissue mass or fluid collection.
IMPRESSION: 1. New mild post-traumatic osteoarthritis of the posterior subtalar
joint.
